# Patient Record
Sex: Male | Born: 2005 | Race: White | Hispanic: No | Marital: Single | Smoking: Never smoker
Health system: Southern US, Community
[De-identification: ages and names within clinical notes are randomized; demographics above are authoritative.]

## PROBLEM LIST (undated history)

## (undated) HISTORY — PX: CLUB FOOT RELEASE: SHX1363

## (undated) HISTORY — PX: WISDOM TOOTH EXTRACTION: SHX21

---
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
## 2024-04-08 ENCOUNTER — Other Ambulatory Visit: Payer: Self-pay

## 2024-04-08 DIAGNOSIS — Z021 Encounter for pre-employment examination: Secondary | ICD-10-CM

## 2024-04-08 NOTE — Progress Notes (Signed)
 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  [facility] Acct #:  [id number] [facility] Specimen #:  [id number]  Rapid drug results = Negative  Physical scheduled for 04/18/2024 at 1:30 pm

## 2024-04-18 ENCOUNTER — Ambulatory Visit: Payer: Self-pay

## 2024-04-18 ENCOUNTER — Ambulatory Visit: Payer: Self-pay | Admitting: Physician Assistant

## 2024-04-18 VITALS — BP 128/76 | HR 72 | Temp 97.7°F | Resp 14 | Ht 72.0 in | Wt 258.0 lb

## 2024-04-18 DIAGNOSIS — Z021 Encounter for pre-employment examination: Secondary | ICD-10-CM

## 2024-04-18 DIAGNOSIS — Z0289 Encounter for other administrative examinations: Secondary | ICD-10-CM

## 2024-04-18 LAB — POCT URINALYSIS DIPSTICK
Bilirubin, UA: NEGATIVE
Blood, UA: NEGATIVE
Glucose, UA: NEGATIVE
Ketones, UA: NEGATIVE
Leukocytes, UA: NEGATIVE
Nitrite, UA: NEGATIVE
Protein, UA: POSITIVE — AB
Spec Grav, UA: 1.02 (ref 1.010–1.025)
Urobilinogen, UA: 0.2 U/dL
pH, UA: 6.5 (ref 5.0–8.0)

## 2024-04-18 NOTE — Progress Notes (Signed)
 [facility] [place] [facility]  ____________________________________________   None    (approximate)  I have reviewed the triage vital signs and the nursing notes.   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 is a 18 y.o. 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 No pertinent past medical history.  There are no active problems to display for this patient.   Past Surgical History:  Procedure Laterality Date   CLUB FOOT RELEASE      Prior to Admission medications   Not on File    Allergies Penicillins and Amoxicillin  Family History  Problem Relation Age of Onset   Healthy Mother     Social History Social History   Tobacco Use   Smoking status: Never   Smokeless tobacco: Never   Tobacco comments:    Uses nicotine pouches using a full approximately every 2-3 days, started 2023  Vaping Use   Vaping status: Never Used  Substance Use Topics   Alcohol use: No   Drug use: No    Review of Systems Constitutional: No fever/chills Eyes: No visual changes. ENT: No sore throat. Cardiovascular: Denies chest pain. Respiratory: Denies shortness of breath. Gastrointestinal: No abdominal pain.  No nausea, no vomiting.  No diarrhea.  No constipation. Genitourinary: Negative for dysuria. Musculoskeletal: Negative for back pain. Skin: Negative for rash. Neurological: Negative for headaches, focal weakness or numbness.  ____________________________________________   PHYSICAL EXAM:  VITAL SIGNS: BP 128/76  BP Location Left Arm  Patient Position Sitting  Cuff Size Large  Pulse Rate 72  Temp 97.7 F (36.5 C)  Temp Source Temporal  Weight 258 lb (117 kg)  Height 6' (1.829 m)  Resp 14  SpO2 97 %   BMI: 34.99 kg/m2  BSA: 2.44 m2  Constitutional: Alert and oriented. Well appearing and in no acute  distress. Eyes: Conjunctivae are normal. PERRL. EOMI. Head: Atraumatic. Nose: No congestion/rhinnorhea. Mouth/Throat: Mucous membranes are moist.  Oropharynx non-erythematous. Neck: No stridor.No cervical spine tenderness to palpation. Hematological/Lymphatic/Immunilogical: No cervical lymphadenopathy. Cardiovascular: Normal rate, regular rhythm. Grossly normal heart sounds.  Good peripheral circulation. Respiratory: Normal respiratory effort.  No retractions. Lungs CTAB. Gastrointestinal: Soft and nontender. No distention. No abdominal bruits. No CVA tenderness. Genitourinary: Deferred Musculoskeletal: No lower extremity tenderness nor edema.  No joint effusions. Neurologic:  Normal speech and language. No gross focal neurologic deficits are appreciated. No gait instability. Skin:  Skin is warm, dry and intact. No rash noted. Psychiatric: Mood and affect are normal. Speech and behavior are normal.  ____________________________________________   LABS Pending _____    Component Ref Range & Units (hover) 14:53  Color, UA yellow  Clarity, UA clear  Glucose, UA Negative  Bilirubin, UA neg  Ketones, UA neg  Spec Grav, UA 1.020  Blood, UA neg  pH, UA 6.5  Protein, UA Positive Abnormal   Comment: 15  Urobilinogen, UA 0.2  Nitrite, UA neg  Leukocytes, UA Negative  Appearance   Odor           _______________________________________  EKG  Sinus rhythm at 70 bpm ____________________________________________     ____________________________________________   INITIAL IMPRESSION / ASSESSMENT AND PLAN  As part of my medical decision making, I reviewed the following data within the electronic MEDICAL RECORD NUMBER              ____________________________________________  FINAL CLINICAL IMPRESSION  Well exam   ED Discharge Orders     None        Note:  This document was prepared using Dragon voice recognition software and may include unintentional  dictation errors.

## 2024-04-20 LAB — CMP12+LP+TP+TSH+6AC+CBC/D/PLT
ALT: 29 IU/L (ref 0–44)
AST: 25 IU/L (ref 0–40)
Albumin: 4.8 g/dL (ref 4.3–5.2)
Alkaline Phosphatase: 103 IU/L (ref 51–125)
BUN/Creatinine Ratio: 15 (ref 9–20)
BUN: 14 mg/dL (ref 6–20)
Basophils Absolute: 0.1 x10E3/uL (ref 0.0–0.2)
Basos: 1 %
Bilirubin Total: <0.2 mg/dL (ref 0.0–1.2)
Calcium: 9.3 mg/dL (ref 8.7–10.2)
Chloride: 102 mmol/L (ref 96–106)
Chol/HDL Ratio: 4.1 ratio (ref 0.0–5.0)
Cholesterol, Total: 160 mg/dL (ref 100–169)
Creatinine, Ser: 0.91 mg/dL (ref 0.76–1.27)
EOS (ABSOLUTE): 0.3 x10E3/uL (ref 0.0–0.4)
Eos: 3 %
Estimated CHD Risk: 0.8 times avg.
Free Thyroxine Index: 2.8 (ref 1.2–4.9)
GGT: 21 IU/L (ref 0–65)
Globulin, Total: 2.9 g/dL (ref 1.5–4.5)
Glucose: 85 mg/dL (ref 70–99)
HDL: 39 mg/dL — ABNORMAL LOW (ref >39)
Hematocrit: 46.7 % (ref 37.5–51.0)
Hemoglobin: 15.3 g/dL (ref 13.0–17.7)
Immature Grans (Abs): 0 x10E3/uL (ref 0.0–0.1)
Immature Granulocytes: 0 %
Iron: 48 ug/dL (ref 38–169)
LDH: 221 IU/L (ref 121–224)
LDL Chol Calc (NIH): 96 mg/dL (ref 0–109)
Lymphocytes Absolute: 2.1 x10E3/uL (ref 0.7–3.1)
Lymphs: 22 %
MCH: 28.9 pg (ref 26.6–33.0)
MCHC: 32.8 g/dL (ref 31.5–35.7)
MCV: 88 fL (ref 79–97)
Monocytes Absolute: 1.2 x10E3/uL — ABNORMAL HIGH (ref 0.1–0.9)
Monocytes: 12 %
Neutrophils Absolute: 6 x10E3/uL (ref 1.4–7.0)
Neutrophils: 62 %
Phosphorus: 3.3 mg/dL — ABNORMAL LOW (ref 3.4–5.5)
Platelets: 258 x10E3/uL (ref 150–450)
Potassium: 4.4 mmol/L (ref 3.5–5.2)
RBC: 5.29 x10E6/uL (ref 4.14–5.80)
RDW: 12.5 % (ref 11.6–15.4)
Sodium: 139 mmol/L (ref 134–144)
T3 Uptake Ratio: 32 % (ref 24–39)
T4, Total: 8.9 ug/dL (ref 4.5–12.0)
TSH: 2.2 u[IU]/mL (ref 0.450–4.500)
Total Protein: 7.7 g/dL (ref 6.0–8.5)
Triglycerides: 142 mg/dL — ABNORMAL HIGH (ref 0–89)
Uric Acid: 5.7 mg/dL (ref 3.8–8.4)
VLDL Cholesterol Cal: 25 mg/dL (ref 5–40)
WBC: 9.6 x10E3/uL (ref 3.4–10.8)
eGFR: 125 mL/min/1.73 (ref >59)

## 2024-04-20 LAB — HEPATITIS B SURFACE ANTIBODY,QUALITATIVE: Hep B Surface Ab, Qual: NONREACTIVE

## 2024-05-22 ENCOUNTER — Ambulatory Visit
Admission: EM | Admit: 2024-05-22 | Discharge: 2024-05-22 | Disposition: A | Discharge status: Home / Self Care | Attending: Emergency Medicine | Admitting: Emergency Medicine

## 2024-05-22 ENCOUNTER — Encounter: Payer: Self-pay | Admitting: Emergency Medicine

## 2024-05-22 DIAGNOSIS — R1031 Right lower quadrant pain: Secondary | ICD-10-CM | POA: Insufficient documentation

## 2024-05-22 DIAGNOSIS — R10823 Right lower quadrant rebound abdominal tenderness: Secondary | ICD-10-CM | POA: Diagnosis not present

## 2024-05-22 DIAGNOSIS — R1110 Vomiting, unspecified: Secondary | ICD-10-CM | POA: Diagnosis present

## 2024-05-22 DIAGNOSIS — R509 Fever, unspecified: Secondary | ICD-10-CM | POA: Diagnosis present

## 2024-05-22 LAB — RESP PANEL BY RT-PCR (FLU A&B, COVID) ARPGX2
Influenza A by PCR: NEGATIVE
Influenza B by PCR: NEGATIVE
SARS Coronavirus 2 by RT PCR: NEGATIVE

## 2024-05-22 LAB — GROUP A STREP BY PCR: Group A Strep by PCR: NOT DETECTED

## 2024-05-22 MED ORDER — ONDANSETRON 8 MG PO TBDP
8.0000 mg | ORAL_TABLET | Freq: Once | ORAL | Status: AC
  Administered 2024-05-22: 8 mg via ORAL

## 2024-05-22 NOTE — Discharge Instructions (Addendum)
 As we discussed, your testing was negative for strep, COVID, and influenza.  Given that you have right lower quadrant tenderness with rebound this is concerning for possible appendicitis.  You need a CT scan of your abdomen and pelvis to rule rule out appendicitis.  Please go to [facility] to be evaluated for your abdominal pain.  Do not eat or drink anything until after you have been evaluated in the ER.  Please go now.

## 2024-05-22 NOTE — ED Triage Notes (Addendum)
 Patient c/o RLQ abdominal pain and vomiting that started on Friday.  Patient unsure of fevers.  Patient only reports fatigue, bodyaches and headache but denies any other cold symptoms. Patient did a home test and his covid test was negative, but Flu B was had a faint line.  Patient also reports diarrhea.

## 2024-05-22 NOTE — ED Provider Notes (Signed)
 [facility]    CSN: [id number] Arrival date & time: 05/22/24  1253      History   Chief Complaint Chief Complaint  Patient presents with   Emesis   Abdominal Pain    HPI Cristian Lara. is a 18 y.o. male.   HPI  17 year old male with past medical history significant for clubfoot with revision presents for evaluation of right lower quadrant pain with nausea and vomiting and decreased appetite that started 2 days ago.  There is no associated fever.  The patient does endorse fatigue, body aches, and headache as well as runny nose, nasal congestion, and infrequent nonproductive cough.  He has also had some diarrhea.  He denies measured fever, sore throat, or ear pain.  He did take home COVID and flu testing and indicates that the flu B test was faintly positive.  History reviewed. No pertinent past medical history.  There are no active problems to display for this patient.   Past Surgical History:  Procedure Laterality Date   CLUB FOOT RELEASE     WISDOM TOOTH EXTRACTION         Home Medications    Prior to Admission medications   Not on File    Family History Family History  Problem Relation Age of Onset   Healthy Mother     Social History Social History   Tobacco Use   Smoking status: Never   Smokeless tobacco: Never   Tobacco comments:    Uses nicotine pouches using a full approximately every 2-3 days, started 2023  Vaping Use   Vaping status: Never Used  Substance Use Topics   Alcohol use: No   Drug use: No     Allergies   Penicillins and Amoxicillin   Review of Systems Review of Systems  Constitutional:  Negative for fever.  HENT:  Positive for congestion and rhinorrhea. Negative for ear pain and sore throat.   Respiratory:  Positive for cough. Negative for shortness of breath and wheezing.   Gastrointestinal:  Positive for abdominal pain, diarrhea, nausea and vomiting.  Musculoskeletal:  Positive for arthralgias and  myalgias.  Skin:  Negative for rash.  Neurological:  Positive for headaches.     Physical Exam Triage Vital Signs ED Triage Vitals  Encounter Vitals Group     BP      Girls Systolic BP Percentile      Girls Diastolic BP Percentile      Boys Systolic BP Percentile      Boys Diastolic BP Percentile      Pulse      Resp      Temp      Temp src      SpO2      Weight      Height      Head Circumference      Peak Flow      Pain Score      Pain Loc      Pain Education      Exclude from Growth Chart    No data found.  Updated Vital Signs BP (!) 149/95 (BP Location: Right Arm)   Pulse 84   Temp 99.4 F (37.4 C) (Oral)   Resp 16   Ht 6' (1.829 m)   Wt 257 lb 15 oz (117 kg)   SpO2 97%   BMI 34.98 kg/m   Visual Acuity Right Eye Distance:   Left Eye Distance:   Bilateral Distance:    Right Eye  Near:   Left Eye Near:    Bilateral Near:     Physical Exam Vitals and nursing note reviewed.  Constitutional:      Appearance: Normal appearance. He is not ill-appearing.  HENT:     Head: Normocephalic and atraumatic.     Right Ear: Tympanic membrane, ear canal and external ear normal. There is no impacted cerumen.     Left Ear: Tympanic membrane, ear canal and external ear normal. There is no impacted cerumen.     Nose: Congestion and rhinorrhea present.     Comments: This mucosa is edematous and erythematous with scant clear discharge in both naris.    Mouth/Throat:     Mouth: Mucous membranes are moist.     Pharynx: Oropharynx is clear. Posterior oropharyngeal erythema present. No oropharyngeal exudate.     Comments: Tonsillar pillars are unremarkable.  Mild erythema to the posterior oropharynx with clear postnasal drip. Cardiovascular:     Rate and Rhythm: Normal rate and regular rhythm.     Pulses: Normal pulses.     Heart sounds: Normal heart sounds. No murmur heard.    No friction rub. No gallop.  Pulmonary:     Effort: Pulmonary effort is normal.     Breath  sounds: Normal breath sounds. No wheezing, rhonchi or rales.  Abdominal:     Palpations: Abdomen is soft.     Tenderness: There is abdominal tenderness. There is rebound. There is no guarding.  Musculoskeletal:     Cervical back: Normal range of motion and neck supple. No tenderness.  Lymphadenopathy:     Cervical: No cervical adenopathy.  Skin:    General: Skin is warm and dry.     Capillary Refill: Capillary refill takes less than 2 seconds.     Findings: No rash.  Neurological:     General: No focal deficit present.     Mental Status: He is alert and oriented to person, place, and time.      UC Treatments / Results  Labs (all labs ordered are listed, but only abnormal results are displayed) Labs Reviewed  GROUP A STREP BY PCR  RESP PANEL BY RT-PCR (FLU A&B, COVID) [id number]    EKG   Radiology No results found.  Procedures Procedures (including critical care time)  Medications Ordered in UC Medications  ondansetron  (ZOFRAN -ODT) disintegrating tablet 8 mg (8 mg Oral Given 05/22/24 1308)    Initial Impression / Assessment and Plan / UC Course  I have reviewed the triage vital signs and the nursing notes.  Pertinent labs & imaging results that were available during my care of the patient were reviewed by me and considered in my medical decision making (see chart for details).   Patient is a pleasant, nontoxic-appearing 18 year old male presenting for evaluation of respiratory and GI complaints that started 2 days ago.  No fever but patient has had right lower quadrant pain with decreased appetite, nausea, vomiting, and diarrhea.  On exam patient's abdomen is soft with right lower quadrant tenderness and tenderness over McBurney's point.  Patient also has rebound tenderness.  The remainder of his physical exam reveals inflamed nasal mucosa with scant clear rhinorrhea as well as erythema to the posterior oropharynx and clear postnasal drip.  No tonsillar hypertrophy or cervical  lymphadenopathy noted.  Cardiopulmonary exam reveals clear lung sounds in all fields.  He did have a positive home flu B tests of his abdominal pain may be mesenteric adenitis.  However, he is experiencing tenderness over McBurney's  point and has rebound tenderness.  Due to the fact the patient is nauseous I will order 8 mg of ODT Zofran  to be given in clinic as well as a strep PCR and a flu and COVID PCR.  Strep PCR is negative.  COVID and flu PCR's are negative.  Given that the patient is experiencing abdominal pain with fever and rebound tenderness on exam) pathology department to evaluate for possible appendicitis.   Final Clinical Impressions(s) / UC Diagnoses   Final diagnoses:  Right lower quadrant abdominal pain     Discharge Instructions      As we discussed, your testing was negative for strep, COVID, and influenza.  Given that you have right lower quadrant tenderness with rebound this is concerning for possible appendicitis.  You need a CT scan of your abdomen and pelvis to rule rule out appendicitis.  Please go to [facility] to be evaluated for your abdominal pain.  Do not eat or drink anything until after you have been evaluated in the ER.  Please go now.     ED Prescriptions   None    PDMP not reviewed this encounter.   Bernardino Ditch, NP 05/22/24 1351

## 2024-05-22 NOTE — ED Notes (Signed)
 Patient is being discharged from the Urgent Care and sent to the [facility] Emergency Department via private vehicle with friend . Per Venetia Motto, NP, patient is in need of higher level of care due to RLQ abdominal tenderness and vomiting. Patient is aware and verbalizes understanding of plan of care.  Vitals:   05/22/24 1307  BP: (!) 149/95  Pulse: 84  Resp: 16  Temp: 99.4 F (37.4 C)  SpO2: 97%

## 2024-05-25 ENCOUNTER — Ambulatory Visit
Admission: RE | Admit: 2024-05-25 | Discharge: 2024-05-25 | Source: Ambulatory Visit | Attending: Family Medicine | Admitting: Family Medicine

## 2024-05-25 ENCOUNTER — Telehealth: Payer: Self-pay | Admitting: Family Medicine

## 2024-05-25 VITALS — BP 136/79 | HR 90 | Temp 100.0°F | Resp 16 | Ht 72.0 in | Wt 250.4 lb

## 2024-05-25 DIAGNOSIS — R10813 Right lower quadrant abdominal tenderness: Secondary | ICD-10-CM | POA: Diagnosis not present

## 2024-05-25 DIAGNOSIS — K529 Noninfective gastroenteritis and colitis, unspecified: Secondary | ICD-10-CM | POA: Diagnosis not present

## 2024-05-25 DIAGNOSIS — K921 Melena: Secondary | ICD-10-CM | POA: Diagnosis not present

## 2024-05-25 DIAGNOSIS — D72829 Elevated white blood cell count, unspecified: Secondary | ICD-10-CM

## 2024-05-25 DIAGNOSIS — R509 Fever, unspecified: Secondary | ICD-10-CM

## 2024-05-25 LAB — CBC WITH DIFFERENTIAL/PLATELET
Abs Immature Granulocytes: 0.03 K/uL (ref 0.00–0.07)
Basophils Absolute: 0.1 K/uL (ref 0.0–0.1)
Basophils Relative: 1 %
Eosinophils Absolute: 0.1 K/uL (ref 0.0–0.5)
Eosinophils Relative: 0 %
HCT: 46.6 % (ref 39.0–52.0)
Hemoglobin: 15.8 g/dL (ref 13.0–17.0)
Immature Granulocytes: 0 %
Lymphocytes Relative: 12 %
Lymphs Abs: 1.4 K/uL (ref 0.7–4.0)
MCH: 28.4 pg (ref 26.0–34.0)
MCHC: 33.9 g/dL (ref 30.0–36.0)
MCV: 83.7 fL (ref 80.0–100.0)
Monocytes Absolute: 1.1 K/uL — ABNORMAL HIGH (ref 0.1–1.0)
Monocytes Relative: 9 %
Neutro Abs: 8.8 K/uL — ABNORMAL HIGH (ref 1.7–7.7)
Neutrophils Relative %: 78 %
Platelets: 351 K/uL (ref 150–400)
RBC: 5.57 MIL/uL (ref 4.22–5.81)
RDW: 12.6 % (ref 11.5–15.5)
WBC: 11.3 K/uL — ABNORMAL HIGH (ref 4.0–10.5)
nRBC: 0 % (ref 0.0–0.2)

## 2024-05-25 LAB — COMPREHENSIVE METABOLIC PANEL WITH GFR
ALT: 28 U/L (ref 0–44)
AST: 21 U/L (ref 15–41)
Albumin: 5.3 g/dL — ABNORMAL HIGH (ref 3.5–5.0)
Alkaline Phosphatase: 85 U/L (ref 38–126)
Anion gap: 14 (ref 5–15)
BUN: 18 mg/dL (ref 6–20)
CO2: 25 mmol/L (ref 22–32)
Calcium: 10.2 mg/dL (ref 8.9–10.3)
Chloride: 97 mmol/L — ABNORMAL LOW (ref 98–111)
Creatinine, Ser: 1.1 mg/dL (ref 0.61–1.24)
GFR, Estimated: >60 mL/min (ref >60)
Glucose, Bld: 86 mg/dL (ref 70–99)
Potassium: 4.5 mmol/L (ref 3.5–5.1)
Sodium: 136 mmol/L (ref 135–145)
Total Bilirubin: 0.9 mg/dL (ref 0.0–1.2)
Total Protein: 9.5 g/dL — ABNORMAL HIGH (ref 6.5–8.1)

## 2024-05-25 LAB — LIPASE, BLOOD: Lipase: 26 U/L (ref 11–51)

## 2024-05-25 LAB — OCCULT BLOOD X 1 CARD TO LAB, STOOL: Fecal Occult Bld: NEGATIVE

## 2024-05-25 MED ORDER — AZITHROMYCIN 250 MG PO TABS: ORAL_TABLET | ORAL | 0 refills | Status: DC

## 2024-05-25 MED ORDER — ONDANSETRON HCL 4 MG/2ML IJ SOLN
4.0000 mg | Freq: Once | INTRAMUSCULAR | Status: AC
  Administered 2024-05-25 (×2): 4 mg via INTRAMUSCULAR

## 2024-05-25 MED ORDER — PROMETHAZINE HCL 25 MG PO TABS: 25.0000 mg | ORAL_TABLET | Freq: Four times a day (QID) | ORAL | 0 refills | Status: DC | PRN

## 2024-05-25 NOTE — ED Triage Notes (Addendum)
 Pt still c/o emesis,fatigue & unable to keep any foods/fluids down ongoing x4 days. States noticed some hematochezia this AM. Was seen at [facility] ED on 8/10 for the same issue. Had CT abd done which showed gastroenteritis. Given zofran  w/o relief.

## 2024-05-25 NOTE — ED Provider Notes (Addendum)
 [facility]    CSN: [id number] Arrival date & time: 05/25/24  1024      History   Chief Complaint Chief Complaint  Patient presents with   Emesis   Hematochezia    HPI Cristian Lankford. is a 18 y.o. male.   HPI  Cristian Lara presents for feeling like trash.  Has been vomiting, soft and watery diarrhea and right lower abdominal pain for over a week. An hour after eating he vomits.  Pain has improved but his vomiting and diarrhea are not.  He is not tolerating food but can drink water.  He had some dark stools since yesterday.  Started having black stools yesterday.  Has had 4 episodes of black looking stools are getting worse.   He was seen in the ED a few days ago. He had a CT scan that no problem with his appendix and prescribed Zofran . Says he had an anaphylaxis to contrast.   No known family history of autoimmune disease.   Past Surgeries: no prior abdominal surgeries   Symptoms Nausea/Vomiting: yes  Diarrhea: no  Constipation: no  Melena/BRBPR: yes  Hematemesis: no  Anorexia: yes  Fever/Chills: no  Dysuria: no  Rash: no  Wt loss: not been eating over the past couple days   EtOH use: no  NSAIDs/ASA: not in the past week  Sore throat: no   Cough: after vomiting  Sleep disturbance: yes  Back Pain: no Headache: yes    History reviewed. No pertinent past medical history.  There are no active problems to display for this patient.   Past Surgical History:  Procedure Laterality Date   CLUB FOOT RELEASE     WISDOM TOOTH EXTRACTION         Home Medications    Prior to Admission medications   Medication Sig Start Date End Date Taking? Authorizing Provider  azithromycin  (ZITHROMAX  Z-PAK) 250 MG tablet Take 2 tablets on day 1 then 1 tablet daily 05/25/24  Yes Jakhi Dishman, DO  promethazine  (PHENERGAN ) 25 MG tablet Take 1 tablet (25 mg total) by mouth every 6 (six) hours as needed for nausea or vomiting. 05/25/24  Yes Bowie Delia, DO   ondansetron  (ZOFRAN -ODT) 4 MG disintegrating tablet Take 4 mg by mouth every 8 (eight) hours as needed.    [provider]    Family History Family History  Problem Relation Age of Onset   Healthy Mother     Social History Social History   Tobacco Use   Smoking status: Never   Smokeless tobacco: Never   Tobacco comments:    Uses nicotine pouches using a full approximately every 2-3 days, started 2023  Vaping Use   Vaping status: Never Used  Substance Use Topics   Alcohol use: No   Drug use: No     Allergies   Iodinated contrast media, Penicillins, and Amoxicillin   Review of Systems Review of Systems :negative unless otherwise stated in HPI.      Physical Exam Triage Vital Signs ED Triage Vitals  Encounter Vitals Group     BP      Girls Systolic BP Percentile      Girls Diastolic BP Percentile      Boys Systolic BP Percentile      Boys Diastolic BP Percentile      Pulse      Resp      Temp      Temp src      SpO2  Weight      Height      Head Circumference      Peak Flow      Pain Score      Pain Loc      Pain Education      Exclude from Growth Chart    No data found.  Updated Vital Signs BP 136/79 (BP Location: Left Arm)   Pulse 90   Temp 100 F (37.8 C) (Oral)   Resp 16   Ht 6' (1.829 m)   Wt 113.6 kg   SpO2 97%   BMI 33.96 kg/m   Visual Acuity Right Eye Distance:   Left Eye Distance:   Bilateral Distance:    Right Eye Near:   Left Eye Near:    Bilateral Near:     Physical Exam  GEN: pleasant non-toxic appearing male, in no acute distress  CV: regular rate and rhythm RESP: no increased work of breathing, clear to ascultation bilaterally ABD: Bowel sounds present. Soft, RLQ tenderness, non-distended.  No guarding, no rebound, no appreciable hepatosplenomegaly, no CVA tenderness, negative McBurney's, negative Murphy RECTAL: negative without mass, lesions or tenderness, no internal or external hemorrhoids  noted, no  anal fissure noted, sphincter tone normal, stool guaiac negative. MSK: no extremity edema SKIN: warm, dry, no rash on visible skin NEURO: alert, moves all extremities appropriately   UC Treatments / Results  Labs (all labs ordered are listed, but only abnormal results are displayed) Labs Reviewed  CBC WITH DIFFERENTIAL/PLATELET - Abnormal; Notable for the following components:      Result Value   WBC 11.3 (*)    Neutro Abs 8.8 (*)    Monocytes Absolute 1.1 (*)    All other components within normal limits  COMPREHENSIVE METABOLIC PANEL WITH GFR - Abnormal; Notable for the following components:   Chloride 97 (*)    Total Protein 9.5 (*)    Albumin 5.3 (*)    All other components within normal limits  LIPASE, BLOOD  OCCULT BLOOD X 1 CARD TO LAB, STOOL    EKG  If EKG performed, see my interpretation and MDM section  Radiology No results found.   Procedures Procedures (including critical care time)  Medications Ordered in UC Medications  ondansetron  (ZOFRAN ) injection 4 mg (4 mg Intramuscular Given 05/25/24 1140)    Initial Impression / Assessment and Plan / UC Course  I have reviewed the triage vital signs and the nursing notes.  Pertinent labs & imaging results that were available during my care of the patient were reviewed by me and considered in my medical decision making (see chart for details).       Patient is a  18 y.o. male who presents after having nausea, vomiting, diarrhea, abdominal pain about a week ago.  He has new onset melena.  He has an elevated temperature here 100 F.  He is normotensive,  and satting well on room air.  Discussed evaluation here versus the emergency department.  Given Zofran  4 mg IM for nausea/vomiting.  Pt seen at [facility] ED on 8/10 and diagnosed with enteritis which was seen on his CT. CT showed mildly thickened small bowel wall loops in the left hemiabdomen. Reviewed ED notes, labs and imaging. He was discharged with Zofran .    Overall, patient is non-toxic-appearing, well-hydrated, and in no acute distress.  Vital signs stable.  Cristian Lara afebrile.  Exam is not concerning for an acute abdomen however he has RLQ abdominal tenderness.   Patient would  like to avoid the ED at this time.  Obtained CBC, CMP, and lipase.  No personal history of kidney stones.    CBC showing no anemia however he has some mild leukocytosis, WBC 11.3 with left shift of both neutrophils and monocytes.  CMP showing proteinemia otherwise unremarkable.  Lipase is normal. Fecal hemoccult is negative despite reports of black stools.  It is interesting that he had left sided enteritis but has RLQ tenderness. GI pathogen panel ordered however pt not able to defecate here.  Patient is to return with his stool sample.     We discussed risk and benefits of treating for possible bacterial gastroenteritis.  Given his elevated temperature and red ongoing symptoms for the past week we will initiate azithromycin .  Advised prebiotic's and/or probiotics to replenish and support his good gut bacteria.  If GI pathogen panel is normal and his symptoms persist recommend he go to the emergency department for evaluation.   Follow-up, return and ED precautions given.  Discussed MDM, treatment plan and plan for follow-up with patient  who agrees with plan.    Final Clinical Impressions(s) / UC Diagnoses   Final diagnoses:  Gastroenteritis  Melena  Right lower quadrant abdominal tenderness, rebound tenderness presence not specified  Elevated temperature  Leukocytosis, unspecified type     Discharge Instructions      Your kidneys, liver and pancreas are functioning properly.  You are not anemic. Your white blood cell count is elevated which is a marker for infection and/or inflammation. Your protein level is elevated.  See handout on the clear liquid diet.  I can not rule out an autoimmune disease or appendicitis here.   Return to the urgent care with your  stool sample. Take the antibiotics as prescribed. Eat yogurt with live culture to replenish your good bacteria.  Limit contact with your 44 month old child for now to prevent possible spread of an infection.  You have been advised to follow up in the emergency department for concerning signs or symptoms as discussed during your visit. Based on concerns about condition, if you do not follow up in the ED, you may risk poor outcomes including worsening of condition, delayed treatment and potentially life threatening issues. If you have declined to go to the ED at this time, you should call your PCP immediately to set up a follow up appointment.   Go to ED for red flag symptoms, including; fevers you cannot reduce with Tylenol/Motrin , severe headaches, vision changes, numbness/weakness in part of the body, lethargy, confusion, intractable vomiting, severe dehydration, chest pain, breathing difficulty, severe persistent abdominal or pelvic pain, signs of severe infection (increased redness, swelling of an area), feeling faint or passing out, dizziness, etc. You should especially go to the ED for sudden acute worsening of condition if you do not elect to go at this time.         ED Prescriptions     Medication Sig Dispense Auth. Provider   azithromycin  (ZITHROMAX  Z-PAK) 250 MG tablet Take 2 tablets on day 1 then 1 tablet daily 6 tablet Petrona Wyeth, DO   promethazine  (PHENERGAN ) 25 MG tablet Take 1 tablet (25 mg total) by mouth every 6 (six) hours as needed for nausea or vomiting. 30 tablet Faolan Springfield, DO      PDMP not reviewed this encounter.       Alveria Mcglaughlin, DO 05/25/24 1524

## 2024-05-25 NOTE — Telephone Encounter (Signed)
 Ordered GI Pathogen panel.

## 2024-05-25 NOTE — Discharge Instructions (Addendum)
 Your kidneys, liver and pancreas are functioning properly.  You are not anemic. Your white blood cell count is elevated which is a marker for infection and/or inflammation. Your protein level is elevated.  See handout on the clear liquid diet.  I can not rule out an autoimmune disease or appendicitis here.   Return to the urgent care with your stool sample. Take the antibiotics as prescribed. Eat yogurt with live culture to replenish your good bacteria.  Limit contact with your 30 month old child for now to prevent possible spread of an infection.  You have been advised to follow up in the emergency department for concerning signs or symptoms as discussed during your visit. Based on concerns about condition, if you do not follow up in the ED, you may risk poor outcomes including worsening of condition, delayed treatment and potentially life threatening issues. If you have declined to go to the ED at this time, you should call your PCP immediately to set up a follow up appointment.   Go to ED for red flag symptoms, including; fevers you cannot reduce with Tylenol/Motrin , severe headaches, vision changes, numbness/weakness in part of the body, lethargy, confusion, intractable vomiting, severe dehydration, chest pain, breathing difficulty, severe persistent abdominal or pelvic pain, signs of severe infection (increased redness, swelling of an area), feeling faint or passing out, dizziness, etc. You should especially go to the ED for sudden acute worsening of condition if you do not elect to go at this time.

## 2024-05-26 LAB — GASTROINTESTINAL PANEL BY PCR, STOOL (REPLACES STOOL CULTURE)

## 2024-05-30 ENCOUNTER — Ambulatory Visit
Admission: RE | Admit: 2024-05-30 | Discharge: 2024-05-30 | Disposition: A | Discharge status: Home / Self Care | Source: Ambulatory Visit | Attending: Family Medicine | Admitting: Family Medicine

## 2024-05-30 VITALS — BP 126/75 | HR 84 | Temp 99.9°F | Resp 16 | Ht 72.0 in | Wt 250.4 lb

## 2024-05-30 DIAGNOSIS — R10817 Generalized abdominal tenderness: Secondary | ICD-10-CM | POA: Diagnosis not present

## 2024-05-30 DIAGNOSIS — R112 Nausea with vomiting, unspecified: Secondary | ICD-10-CM | POA: Diagnosis not present

## 2024-05-30 NOTE — ED Triage Notes (Signed)
 Pt presents to UC for f/u from [facility] ED on 8/14. States still having RLQ pain that comes & goes. Still taking abx & phenergan . Was told to f/u with PCP & GI. Has appt w/GI on 12/15. Req note to return to work & be on light duty.

## 2024-05-30 NOTE — ED Provider Notes (Signed)
 [facility]    CSN: [id number] Arrival date & time: 05/30/24  1114      History   Chief Complaint Chief Complaint  Patient presents with  . Follow-up    HPI Khayri Kargbo. is a 18 y.o. male.   HPI  Mildred presents for RLQ abdominal pain, seeing spots, nausea.  He no longer has blood in his stool.  Reports seeing occupational health and was told he wasn't getting cleared for work.  Notes he says spots with physical activity. On Saturday, he had vomiting again after not using his nausea medications.  Took all the antibiotics.   He went to the ED but didn't get another CT scan after waiting for over 12 hours.  He has an appointment with GI in December.      History reviewed. No pertinent past medical history.  There are no active problems to display for this patient.   Past Surgical History:  Procedure Laterality Date  . CLUB FOOT RELEASE    . WISDOM TOOTH EXTRACTION         Home Medications    Prior to Admission medications   Medication Sig Start Date End Date Taking? Authorizing Provider  azithromycin  (ZITHROMAX  Z-PAK) 250 MG tablet Take 2 tablets on day 1 then 1 tablet daily 05/25/24   Briele Lagasse, DO  ondansetron  (ZOFRAN -ODT) 4 MG disintegrating tablet Take 4 mg by mouth every 8 (eight) hours as needed.    [provider]  promethazine  (PHENERGAN ) 25 MG tablet Take 1 tablet (25 mg total) by mouth every 6 (six) hours as needed for nausea or vomiting. 05/25/24   Kriste Berth, DO    Family History Family History  Problem Relation Age of Onset  . Healthy Mother     Social History Social History   Tobacco Use  . Smoking status: Never  . Smokeless tobacco: Never  . Tobacco comments:    Uses nicotine pouches using a full approximately every 2-3 days, started 2023  Vaping Use  . Vaping status: Never Used  Substance Use Topics  . Alcohol use: No  . Drug use: No     Allergies   Iodinated contrast media, Penicillins, and  Amoxicillin   Review of Systems Review of Systems :negative unless otherwise stated in HPI.      Physical Exam Triage Vital Signs ED Triage Vitals  Encounter Vitals Group     BP 05/30/24 1143 126/75     Girls Systolic BP Percentile --      Girls Diastolic BP Percentile --      Boys Systolic BP Percentile --      Boys Diastolic BP Percentile --      Pulse Rate 05/30/24 1143 84     Resp 05/30/24 1143 16     Temp 05/30/24 1143 99.9 F (37.7 C)     Temp Source 05/30/24 1143 Oral     SpO2 05/30/24 1143 97 %     Weight 05/30/24 1142 250 lb 6.4 oz (113.6 kg)     Height 05/30/24 1142 6' (1.829 m)     Head Circumference --      Peak Flow --      Pain Score 05/30/24 1156 3     Pain Loc --      Pain Education --      Exclude from Growth Chart --    No data found.  Updated Vital Signs BP 126/75 (BP Location: Right Arm)   Pulse 84  Temp 99.9 F (37.7 C) (Oral)   Resp 16   Ht 6' (1.829 m)   Wt 113.6 kg   SpO2 97%   BMI 33.96 kg/m   Visual Acuity Right Eye Distance:   Left Eye Distance:   Bilateral Distance:    Right Eye Near:   Left Eye Near:    Bilateral Near:     Physical Exam  GEN: non-toxic appearing male, in no acute distress  CV: regular rate RESP: no increased work of breathing  ABD: Bowel sounds present. Soft, generalized tenderness to light palpation, non-distended.  No guarding, no rebound, no appreciable hepatosplenomegaly MSK: no extremity edema SKIN: warm, dry, no rash on visible skin NEURO: alert, moves all extremities appropriately   UC Treatments / Results  Labs (all labs ordered are listed, but only abnormal results are displayed) Labs Reviewed - No data to display  EKG  If EKG performed, see my interpretation and MDM section  Radiology No results found.   Procedures Procedures (including critical care time)  Medications Ordered in UC Medications - No data to display  Initial Impression / Assessment and Plan / UC Course  I have  reviewed the triage vital signs and the nursing notes.  Pertinent labs & imaging results that were available during my care of the patient were reviewed by me and considered in my medical decision making (see chart for details).     Patient is a  18 y.o. male who presents after having nausea, vomiting, diarrhea, abdominal pain for about 2 weeks.  Melena has resolved.  He continues to have an elevated temperature here 99.9 F.  He is normotensive,  and satting well on room air.  Discussed GI evaluation, PCP outpatient imaging versus the emergency department. Pt wanting to go back to work as soon as possible.    Pt seen at [facility] ED on 8/10 and diagnosed with enteritis which was seen on his CT. CT showed mildly thickened small bowel wall loops in the left hemiabdomen. Reviewed ED notes, labs and imaging. He was discharged with Zofran .   He was seen here 8/163/25    Overall, patient is non-toxic-appearing, well-hydrated, and in no acute distress.  Vital signs stable.  Kennethis afebrile.  Exam is not concerning for an acute abdomen however he has RLQ abdominal tenderness.   Patient would like to avoid the ED at this time.  Obtained CBC, CMP, and lipase.  No personal history of kidney stones.     CBC showing no anemia however he has some mild leukocytosis, WBC 11.3 with left shift of both neutrophils and monocytes.  CMP showing proteinemia otherwise unremarkable.  Lipase is normal. Fecal hemoccult is negative despite reports of black stools.  It is interesting that he had left sided enteritis but has RLQ tenderness. GI pathogen panel ordered however pt not able to defecate here.  Patient is to return with his stool sample.     We discussed risk and benefits of treating for possible bacterial gastroenteritis.  Given his elevated temperature and red ongoing symptoms for the past week we will initiate azithromycin .  Advised prebiotic's and/or probiotics to replenish and support his good gut  bacteria.   If GI pathogen panel is normal and his symptoms persist recommend he go to the emergency department for evaluation.     Follow-up, return and ED precautions given.  Discussed MDM, treatment plan and plan for follow-up with patient  who agrees with plan.   Final Clinical Impressions(s) / UC  Diagnoses   Final diagnoses:  None   Discharge Instructions   None    ED Prescriptions   None    PDMP not reviewed this encounter.

## 2024-05-30 NOTE — Discharge Instructions (Addendum)
 You continue to have abdominal pain with nausea and vomiting.  You likely need a repeat CT Scan which can be ordered by your primary care provider, a gut doctor called a gastroenterologist or in the emergency department.    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 Follow up wit occupational health to obtain clearance for this.

## 2024-06-02 ENCOUNTER — Ambulatory Visit (INDEPENDENT_AMBULATORY_CARE_PROVIDER_SITE_OTHER): Admitting: Family Medicine

## 2024-06-02 ENCOUNTER — Encounter: Payer: Self-pay | Admitting: Family Medicine

## 2024-06-02 VITALS — BP 122/81 | HR 95 | Temp 98.7°F | Resp 16 | Ht 72.0 in | Wt 254.2 lb

## 2024-06-02 DIAGNOSIS — Z7689 Persons encountering health services in other specified circumstances: Secondary | ICD-10-CM | POA: Diagnosis not present

## 2024-06-02 DIAGNOSIS — R1031 Right lower quadrant pain: Secondary | ICD-10-CM

## 2024-06-02 MED ORDER — PROMETHAZINE HCL 25 MG PO TABS: 25.0000 mg | ORAL_TABLET | Freq: Four times a day (QID) | ORAL | 0 refills | Status: DC | PRN

## 2024-06-02 NOTE — Progress Notes (Signed)
 New Patient Office Visit  Subjective   Patient ID: Cristian Vigen., male    DOB: 02/07/2006  Age: 18 y.o. MRN: [id number]  CC:  Chief Complaint  Patient presents with   Establish Care   Abdominal Pain    Seen in UC and then did CT at [facility] last week. Nausea and vomiting last week followed by blood in stool. Sent to ED after blood in stool and ED referred to GI. Still having nausea but Phenergan  worked better than Zofran . Followed up with UC to get cleared for work at [employer] but they could not clear patient. Still has pain and nausea.  GI December 13- Needs to return to light duty work so he can remain in [employer] without doing heavy work until seeing GI.    Discussed the use of AI scribe software for clinical note transcription with the patient, who gave verbal consent to proceed.  History of Present Illness   Cristian Hutsell. is a 18 year old male who presents with persistent nausea and right lower quadrant abdominal pain.  He has been experiencing persistent nausea and right lower quadrant abdominal pain for the past two weeks. The nausea is severe without Phenergan , which he takes every six hours. Without it, he feels nauseous. Phenergan  is the only medication that alleviates his nausea, as Zofran  and another anti-nausea medication via injection were not effective.  The abdominal pain is located in the right lower quadrant and worsens with pressure, such as when his son accidentally kicks him. The pain is currently rated at 3-4 out of 10, an improvement from the initial ER visit. He also experiences diarrhea, though the list of symptoms has decreased since onset. A CT scan of the abdomen and pelvis was performed, and he has had at least five UC/ED visits, in the past two weeks.  His appetite has diminished; he can eat only minimal and bland foods like apples and plain chicken. The smell of food can increase his nausea, and while he does not vomit, he experiences a  'bubbling' sensation in his stomach. He recalls a period of two days when his stool was very dark and gelatinous, though this has not occurred recently. No recent fever or chills.  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 Due to his symptoms, he has been unable to participate in the physical training and has missed two weeks of classes. He is concerned about missing classes and how this may affect his training and employment, since he was recently been hired.   He has no significant past medical history, though he was previously diagnosed with gastroenteritis at the first UC visit, which resolved quickly with probiotics. This current episode is notably different and more severe. He has been trying to stay hydrated with water.      Outpatient Encounter Medications as of 06/02/2024  Medication Sig   [DISCONTINUED] promethazine  (PHENERGAN ) 25 MG tablet Take 1 tablet (25 mg total) by mouth every 6 (six) hours as needed for nausea or vomiting.   azithromycin  (ZITHROMAX  Z-PAK) 250 MG tablet Take 2 tablets on day 1 then 1 tablet daily (Patient not taking: Reported on 06/02/2024)   ondansetron  (ZOFRAN -ODT) 4 MG disintegrating tablet Take 4 mg by mouth every 8 (eight) hours as needed. (Patient not taking: Reported on 06/02/2024)   promethazine  (PHENERGAN ) 25 MG tablet Take 1 tablet (25 mg total) by mouth every 6 (six) hours as needed for  nausea or vomiting.   No facility-administered encounter medications on file as of 06/02/2024.    There are no active problems to display for this patient.  History reviewed. No pertinent past medical history. Past Surgical History:  Procedure Laterality Date   CLUB FOOT RELEASE     WISDOM TOOTH EXTRACTION     Family History  Problem Relation Age of Onset   Healthy Mother    Social History   Socioeconomic History   Marital status: Single    Spouse name: Not on file   Number of children: Not on file   Years  of education: Not on file   Highest education level: Not on file  Occupational History   Not on file  Tobacco Use   Smoking status: Never   Smokeless tobacco: Never   Tobacco comments:    Uses nicotine pouches using a full approximately every 2-3 days, started 2023  Vaping Use   Vaping status: Never Used  Substance and Sexual Activity   Alcohol use: No   Drug use: No   Sexual activity: Not on file  Other Topics Concern   Not on file  Social History Narrative   Not on file   Social Drivers of Health   Financial Resource Strain: Not on file  Food Insecurity: Not on file  Transportation Needs: Not on file  Physical Activity: Not on file  Stress: Not on file  Social Connections: Not on file  Intimate Partner Violence: Not on file   Outpatient Medications Prior to Visit  Medication Sig Dispense Refill   promethazine  (PHENERGAN ) 25 MG tablet Take 1 tablet (25 mg total) by mouth every 6 (six) hours as needed for nausea or vomiting. 30 tablet 0   azithromycin  (ZITHROMAX  Z-PAK) 250 MG tablet Take 2 tablets on day 1 then 1 tablet daily (Patient not taking: Reported on 06/02/2024) 6 tablet 0   ondansetron  (ZOFRAN -ODT) 4 MG disintegrating tablet Take 4 mg by mouth every 8 (eight) hours as needed. (Patient not taking: Reported on 06/02/2024)     No facility-administered medications prior to visit.   Allergies  Allergen Reactions   Iodinated Contrast Media Anaphylaxis    Throat swelling   Penicillins Hives   Amoxicillin Rash   ROS: see HPI     Objective   Today's Vitals   06/02/24 1538  BP: 122/81  Pulse: 95  Resp: 16  Temp: 98.7 F (37.1 C)  TempSrc: Oral  SpO2: 99%  Weight: 254 lb 3.2 oz (115.3 kg)  Height: 6' (1.829 m)  PainSc: 0-No pain   Physical Exam Vitals reviewed.  Constitutional:      Appearance: Normal appearance. He is well-developed.  Cardiovascular:     Rate and Rhythm: Normal rate and regular rhythm.     Pulses: Normal pulses.     Heart sounds:  Normal heart sounds.  Pulmonary:     Effort: Pulmonary effort is normal.     Breath sounds: Normal breath sounds.  Abdominal:     General: Bowel sounds are normal.     Palpations: Abdomen is soft.     Tenderness: There is abdominal tenderness in the right lower quadrant and left lower quadrant.  Neurological:     Mental Status: He is alert.  Psychiatric:        Mood and Affect: Mood normal.        Behavior: Behavior normal.    Assessment & Plan:   1. Encounter to establish care (Primary) Patient is a 66- year-old  male who presents today to establish care with primary care at [facility]. Reviewed the past medical history, family history, social history, surgical history, medications and allergies today- updates made as indicated. No past medical history; however over the past 2 weeks he has experienced melena, nausea, decreased appetite, and lower abdominal pain- which is his main concern today.   2. RLQ abdominal pain Patient presents today for right lower quadrant abdominal pain with associated nausea and diarrhea. Vital signs stable. He reports the pain is persistent and does not improve. CT scan normal for appendix, shows left-sided gastroenteritis and mildly thickened small bowel wall loops in the left hemiabdomen. Phenergan  effective for nausea but causes dizziness. Abdominal tenderness noted with palpation in bilateral lower quadrants and current nausea. Awaiting GI evaluation. Patient understands when to seek emergency care.  - Refilled Phenergan , 30 tablets, every six hours as needed for nausea. - Placed urgent GI referral for evaluation next week. 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 - Scheduled follow-up in four weeks to review GI findings and assess fitness for physical activity.  - Ambulatory referral to Gastroenterology  Return in about 4 weeks (around 06/30/2024) for abd pain .   Evalene Arts, FNP

## 2024-06-09 ENCOUNTER — Encounter: Payer: Self-pay | Admitting: Gastroenterology

## 2024-06-28 ENCOUNTER — Telehealth: Payer: Self-pay

## 2024-06-28 NOTE — Telephone Encounter (Signed)
   Copied from CRM [id number]. Topic: Clinical - Medical Advice >> Jun 28, 2024 12:49 PM Tysheama G wrote: Reason for CRM: Patient wanted to know if Dr.Butler wants to see him before he sees his gastro because he hasn't see a gastro yet. Wants to know what to do? Callback number [phone]

## 2024-06-30 ENCOUNTER — Ambulatory Visit: Admitting: Family Medicine

## 2024-07-14 ENCOUNTER — Ambulatory Visit (INDEPENDENT_AMBULATORY_CARE_PROVIDER_SITE_OTHER): Admitting: Family Medicine

## 2024-07-14 ENCOUNTER — Encounter: Payer: Self-pay | Admitting: Family Medicine

## 2024-07-14 VITALS — BP 115/75 | HR 75 | Resp 16 | Ht 72.01 in | Wt 265.8 lb

## 2024-07-14 DIAGNOSIS — R1031 Right lower quadrant pain: Secondary | ICD-10-CM | POA: Diagnosis not present

## 2024-07-14 NOTE — Patient Instructions (Signed)

## 2024-07-14 NOTE — Progress Notes (Signed)
   Established Patient Office Visit  Subjective  Patient ID: Cristian Schaumburg., male    DOB: 11-17-2005  Age: 18 y.o. MRN: [id number]  Chief Complaint  Patient presents with   Abdominal Pain   Discussed the use of AI scribe software for clinical note transcription with the patient, who gave verbal consent to proceed.  History of Present Illness    Cristian Allred. is a 18 year old male who presents for follow-up of abdominal pain.  Cristian Lara feels better and has not experienced any further symptoms such as dark stools, nausea, or decreased appetite. Reports Cristian Lara started feeling better about 2 weeks ago. Cristian Lara has not needed to use Phenergan  since his last visit. His symptoms improved approximately two weeks after his previous appointment, which was about two weeks ago.  Cristian Lara has not yet seen a gastroenterologist despite an urgent referral, as his appointment has been rescheduled multiple times, now set for mid-October.  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     ROS: see HPI    Objective:     BP 115/75   Pulse 75   Resp 16   Ht 6' 0.01 (1.829 m)   Wt 265 lb 12.8 oz (120.6 kg)   SpO2 99%   BMI 36.04 kg/m  BP Readings from Last 3 Encounters:  07/14/24 115/75  06/02/24 122/81  05/30/24 126/75    Physical Exam Vitals reviewed.  Constitutional:      Appearance: Normal appearance.  Cardiovascular:     Rate and Rhythm: Normal rate and regular rhythm.     Pulses: Normal pulses.     Heart sounds: Normal heart sounds.  Pulmonary:     Effort: Pulmonary effort is normal.     Breath sounds: Normal breath sounds.  Abdominal:     General: Bowel sounds are normal. There is no distension.     Palpations: Abdomen is soft. There is no mass.     Tenderness: There is  no abdominal tenderness. There is no guarding or rebound.     Hernia: No hernia is present.  Neurological:     Mental Status: Cristian Lara is alert.  Psychiatric:        Mood and Affect: Mood normal.        Behavior: Behavior normal.     Assessment & Plan:   1. RLQ abdominal pain (Primary) Abdominal pain resolved. Previous melena suggests possible gastrointestinal bleed. Advised to keep gastroenterologist appointment for further evaluation. Cleared for work, fit for full duty.    Return in about 6 months (around 01/12/2025) for Physical with fasting labs.    Evalene Arts, FNP

## 2024-07-27 ENCOUNTER — Encounter: Payer: Self-pay | Admitting: Internal Medicine

## 2024-07-27 ENCOUNTER — Ambulatory Visit: Admitting: Internal Medicine

## 2024-07-27 VITALS — BP 120/79 | HR 74 | Temp 97.8°F | Ht 72.0 in | Wt 264.5 lb

## 2024-07-27 DIAGNOSIS — K529 Noninfective gastroenteritis and colitis, unspecified: Secondary | ICD-10-CM

## 2024-07-27 DIAGNOSIS — R1031 Right lower quadrant pain: Secondary | ICD-10-CM

## 2024-07-27 DIAGNOSIS — R112 Nausea with vomiting, unspecified: Secondary | ICD-10-CM

## 2024-07-27 DIAGNOSIS — Z8719 Personal history of other diseases of the digestive system: Secondary | ICD-10-CM

## 2024-07-27 NOTE — Progress Notes (Signed)
 Primary Care Physician:  Towana Small, FNP Primary Gastroenterologist:  Dr. Cindie  Chief Complaint  Patient presents with   New Patient (Initial Visit)    Patient here today as a new patient due to having RLQ pain. Patient says this started in August, and has since subsided. Patient says she has seen some dark blood clots in his stools. Patient says the last time he saw this was two weeks ago. Patient denies any constipation, or hemorrhoid  issues.    HPI:   Cristian Lara. is a 18 y.o. male who presents to clinic today by referral from his PCP Tawni Towana for evaluation.  Patient was in his normal state of health until 05/22/2024 when he had sudden onset of right lower quadrant abdominal pain, nausea vomiting, body aches, rectal bleeding.  Presented to urgent care, stool studies were negative for infectious pathogens.  Went to the ER and had CT abdomen pelvis with IV contrast which showed mild enteritis of the proximal small bowel.  Was treated conservatively discharged home.  Presented back to the ER 05/25/2024 due to lack of improvement in symptoms.  Was started on azithromycin  as well as given antiemetics for symptomatic improvement.  Today, patient states his symptoms have completely resolved.  No longer having abdominal pain.  Stools are formed, normal.  No further diarrhea.  Denies any nausea or vomiting.  He is unsure about family history in regards to underlying inflammatory bowel disease or colon malignancy.   History reviewed. No pertinent past medical history.  Past Surgical History:  Procedure Laterality Date   CLUB FOOT RELEASE     WISDOM TOOTH EXTRACTION      No current outpatient medications on file.   No current facility-administered medications for this visit.    Allergies as of 07/27/2024 - Review Complete 07/27/2024  Allergen Reaction Noted   Iodinated contrast media Anaphylaxis 05/25/2024   Penicillins Hives 11/25/2017   Amoxicillin  Rash 05/13/2015    Family History  Problem Relation Age of Onset   Healthy Mother    Diabetes Father     Social History   Socioeconomic History   Marital status: Single    Spouse name: Not on file   Number of children: Not on file   Years of education: Not on file   Highest education level: Not on file  Occupational History   Not on file  Tobacco Use   Smoking status: Never   Smokeless tobacco: Never   Tobacco comments:    Uses nicotine pouches using a full approximately every 2-3 days, started 2023  Vaping Use   Vaping status: Never Used  Substance and Sexual Activity   Alcohol use: No   Drug use: No   Sexual activity: Not on file  Other Topics Concern   Not on file  Social History Narrative   Not on file   Social Drivers of Health   Financial Resource Strain: Not on file  Food Insecurity: Not on file  Transportation Needs: Not on file  Physical Activity: Not on file  Stress: Not on file  Social Connections: Not on file  Intimate Partner Violence: Not on file    Subjective: Review of Systems  Constitutional:  Negative for chills and fever.  HENT:  Negative for congestion and hearing loss.   Eyes:  Negative for blurred vision and double vision.  Respiratory:  Negative for cough and shortness of breath.   Cardiovascular:  Negative for chest pain and palpitations.  Gastrointestinal:  Negative for abdominal pain, blood in stool, constipation, diarrhea, heartburn, melena and vomiting.  Genitourinary:  Negative for dysuria and urgency.  Musculoskeletal:  Negative for joint pain and myalgias.  Skin:  Negative for itching and rash.  Neurological:  Negative for dizziness and headaches.  Psychiatric/Behavioral:  Negative for depression. The patient is not nervous/anxious.        Objective: BP 120/79 (BP Location: Left Arm, Patient Position: Sitting, Cuff Size: Large)   Pulse 74   Temp 97.8 F (36.6 C) (Temporal)   Ht 6' (1.829 m)   Wt 264 lb 8 oz (120 kg)    BMI 35.87 kg/m  Physical Exam Constitutional:      Appearance: Normal appearance.  HENT:     Head: Normocephalic and atraumatic.  Eyes:     Extraocular Movements: Extraocular movements intact.     Conjunctiva/sclera: Conjunctivae normal.  Cardiovascular:     Rate and Rhythm: Normal rate and regular rhythm.  Pulmonary:     Effort: Pulmonary effort is normal.     Breath sounds: Normal breath sounds.  Abdominal:     General: Bowel sounds are normal.     Palpations: Abdomen is soft.  Musculoskeletal:        General: Normal range of motion.     Cervical back: Normal range of motion and neck supple.  Skin:    General: Skin is warm.  Neurological:     General: No focal deficit present.     Mental Status: He is alert and oriented to person, place, and time.  Psychiatric:        Mood and Affect: Mood normal.        Behavior: Behavior normal.      Assessment: *Enteritis *Nausea and vomiting-resolved *Abdominal pain-resolved *Rectal bleeding-resolved  Plan: Discussed in depth with patient today.  Likely acute episode of enteritis from infectious source.  Symptoms have totally resolved at this time.  Will check fecal calprotectin.  If normal would recommend continue monitoring.  If elevated, will proceed with colonoscopy to further evaluate.  Otherwise follow-up with GI as needed.  Thank you Evalene Arts for the kind referral.  07/27/2024 11:29 AM

## 2024-07-27 NOTE — Patient Instructions (Signed)
 I am going to order stool testing at [facility].  If this is abnormal, then we will schedule you for colonoscopy.  If normal then I think we can just watch you and see how you do.  It was very nice meeting you both today.  Dr. Cindie

## 2024-07-28 ENCOUNTER — Ambulatory Visit: Admitting: Internal Medicine
